# Patient Record
Sex: Male | Born: 1945 | Race: White | Hispanic: No | Marital: Married
Health system: Southern US, Community
[De-identification: ages and names within clinical notes are randomized; demographics above are authoritative.]

---

## 2010-06-25 ENCOUNTER — Ambulatory Visit: Payer: Self-pay | Admitting: Internal Medicine

## 2010-09-25 ENCOUNTER — Ambulatory Visit: Payer: Self-pay | Admitting: Internal Medicine

## 2011-03-07 ENCOUNTER — Ambulatory Visit: Payer: Self-pay | Admitting: Unknown Physician Specialty

## 2011-03-13 ENCOUNTER — Ambulatory Visit: Payer: Self-pay | Admitting: Internal Medicine

## 2011-03-13 ENCOUNTER — Ambulatory Visit: Payer: Self-pay | Admitting: Unknown Physician Specialty

## 2011-03-17 ENCOUNTER — Inpatient Hospital Stay: Payer: Self-pay | Admitting: Internal Medicine

## 2011-03-17 LAB — PATHOLOGY REPORT

## 2011-03-24 ENCOUNTER — Ambulatory Visit: Payer: Self-pay | Admitting: Internal Medicine

## 2011-03-26 ENCOUNTER — Ambulatory Visit: Payer: Self-pay | Admitting: Internal Medicine

## 2011-04-12 ENCOUNTER — Ambulatory Visit: Payer: Self-pay | Admitting: Internal Medicine

## 2011-06-30 ENCOUNTER — Ambulatory Visit: Payer: Self-pay | Admitting: Unknown Physician Specialty

## 2014-06-15 ENCOUNTER — Ambulatory Visit: Payer: Self-pay | Admitting: Orthopedic Surgery

## 2014-06-29 ENCOUNTER — Ambulatory Visit: Payer: Self-pay | Admitting: Orthopedic Surgery

## 2014-07-11 ENCOUNTER — Emergency Department: Payer: Self-pay | Admitting: Emergency Medicine

## 2014-07-12 ENCOUNTER — Ambulatory Visit
Admit: 2014-07-12 | Disposition: A | Discharge status: Home / Self Care | Payer: Self-pay | Attending: Internal Medicine | Admitting: Internal Medicine

## 2014-07-12 ENCOUNTER — Inpatient Hospital Stay: Payer: Self-pay | Admitting: Internal Medicine

## 2014-07-27 ENCOUNTER — Emergency Department: Payer: Self-pay | Admitting: Emergency Medicine

## 2014-07-30 ENCOUNTER — Inpatient Hospital Stay: Payer: Self-pay | Admitting: Internal Medicine

## 2014-08-03 LAB — BASIC METABOLIC PANEL
Anion Gap: 4 — ABNORMAL LOW (ref 7–16)
BUN: 13 mg/dL
CALCIUM: 8.1 mg/dL — AB
Chloride: 99 mmol/L — ABNORMAL LOW
Co2: 27 mmol/L
Creatinine: 0.64 mg/dL
GLUCOSE: 142 mg/dL — AB
POTASSIUM: 4.4 mmol/L
Sodium: 130 mmol/L — ABNORMAL LOW

## 2014-08-03 LAB — MAGNESIUM: Magnesium: 1.7 mg/dL

## 2014-08-08 LAB — CULTURE, BLOOD (SINGLE)

## 2014-08-11 ENCOUNTER — Ambulatory Visit
Admit: 2014-08-11 | Disposition: A | Discharge status: Home / Self Care | Payer: Self-pay | Attending: Internal Medicine | Admitting: Internal Medicine

## 2014-08-12 LAB — EXPECTORATED SPUTUM ASSESSMENT W REFEX TO RESP CULTURE

## 2014-08-12 LAB — BODY FLUID CULTURE

## 2014-08-26 LAB — CULTURE, BLOOD (SINGLE)

## 2014-09-10 NOTE — Consult Note (Signed)
PATIENT NAME:  Brandon Rhodes, Brandon Rhodes MR#:  [id number] DATE OF BIRTH:  31-Oct-1945  DATE OF CONSULTATION:  07/31/2014  REFERRING PHYSICIAN:  Dr. Allena Katz CONSULTING PHYSICIAN:  Stann Mainland. Sampson Goon, MD  INFECTIOUS DISEASE CONSULTATION    REASON FOR CONSULTATION: Bacteremia and pleural effusion.   HISTORY OF PRESENT ILLNESS: A pleasant 69 year old gentleman with recent admission for left-sided cheset pain and white out of his chest, found to have lung cancer with left lung collapse and pleural effusion. The patient had placement of Pleurx catheter by Dr. Thelma Barge. The catheter is for symptomatic relief. He started chemotherapy. He was admitted March 20 with shortness of breath and fever. Fevers to 101. The patient has been admitted and had a drainage from his catheter. Blood cultures were positive. The pleural fluid had 2800 white cells in it and Gram stain showed gram-negative coccobacillus. We are consulted for further antibiotic management.   PAST MEDICAL HISTORY:  1.  Left-sided lung cancer.  2.  Tobacco abuse.  3.  Alcohol use.  4.  COPD.   5.  Chronic pain syndrome.  6.  GERD.   SOCIAL HISTORY: The patient continues to smoke. Lives at home. No other drug use. Former alcohol use.   FAMILY HISTORY: Noncontributory.   ALLERGIES: NOVOCAIN AND PENICILLIN.   ANTIBIOTICS SINCE ADMISSION: Include azithromycin, meropenem, and vancomycin.   REVIEW OF SYSTEMS: Eleven systems reviewed and negative except as per HPI.   PHYSICAL EXAMINATION:  VITAL SIGNS: Temperature 98.3, pulse 92, blood pressure 92/57, respirations 17, saturation 94% on 2 L.  GENERAL: He is chronically ill-appearing. He is on oxygen. He is in some respiratory distress. He is quite thin.  HEENT: Pupils reactive. Sclerae anicteric. Oropharynx clear.  NECK: Supple.  HEART: Regular.  LUNGS: Marked decreased breath sounds on the left side.  ABDOMEN: Soft, nontender, mildly distended.  CHEST: He has left Pleurx catheter in his left chest.   EXTREMITIES: Trace edema, bilateral lower extremity.   LABORATORY DATA: Blood cultures, March 20, gram-negative coccobacillus in 2/2 cultures. Sputum culture is being held for possible pathogen. Thoracentesis fluid, March 21, many white cells, a few gram-negative coccobacillus. Body fluid analysis, March 21, had 2802 white cells in it, 79% of which are neutrophils. LDH was 224, glucose 107. Fluid from March 21 has 338 white cells, 53 neutrophils, 38 lymphocytes, glucose at that time was 140, protein 2.8.   IMAGING: CT of the chest, March 20, showed collapse of the left lung, large necrotic mass involving much of the left lung. There is necrotic fluid tracking into the left main stem bronchus just inferior to the level of the carina. There is soft tissue mass also noted extending into the left side of the mediastinum surrounding the left main stem bronchus. A very large left-sided pneumothorax.   IMPRESSION: A 69 year old gentleman newly diagnosed lung cancer underwent chemotherapy and has Pleurx catheter in place. He was admitted with fevers. He has had blood cultures, which are positive for gram-negative coccobacillus. Pleural fluid is also positive on Gram stain for gram-negative coccobacillus with 2800 white cells, mainly neutrophils, in his pleural fluid.   RECOMMENDATIONS:  1.  Continue current antibiotics. Further antibiotic recommendations pending identification of the organism. He has necrotic mass, which may also have some infection in it.  2.  He may need a further management of the fluid with a chest tube if he cannot be drained with the Pleurx catheter. I will need to talk with Dr. Thelma Barge further about this.  Thank you for the  consult. I will be glad to follow with you.    ____________________________ Stann Mainlandavid P. Sampson GoonFitzgerald, MD dpf:bm D: 07/31/2014 22:08:40 ET T: 07/31/2014 23:36:40 ET JOB#: [id number]  cc: Stann Mainlandavid P. Sampson GoonFitzgerald, MD, <Dictator> Derryl Uher Sampson GoonFITZGERALD MD ELECTRONICALLY SIGNED  08/03/2014 22:15

## 2014-09-10 NOTE — H&P (Signed)
PATIENT NAME:  Brandon Rhodes, Brandon Rhodes MR#:  [id number] DATE OF BIRTH:  Mar 21, 1946  DATE OF ADMISSION:  07/12/2014  CHIEF COMPLAINT AND REASON FOR ADMISSION: Newly diagnosed large left pleural effusion with opacified left chest on CT scan done on 07/11/2014, left lung collapse. Poor oral intake with clinical dehydration, hypercalcemia. Likely has lung cancer, with pleural fluid. Known history of cirrhosis of the liver.   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 Mechele Collin, history of adenomatous polyp of the colon who more recently has been declining over the last 1-2 months. The patient has had progressive cough, dyspnea on exertion and sometimes at rest. He also has not been eating and drinking well for the past 1-2 weeks, and is feeling weaker. States that he has lost about 7-8 pounds in the last 1-2 months. He was found to have grossly abnormal chest x-ray and went to Emergency Room yesterday where CT scan of the chest, abdomen, and pelvis was done, which reported left lung completely collapsed with areas of necrosis in the left lower lobe most compatible with necrotic neoplasm along with a large left pleural effusion. There was evidence of cirrhosis of the liver, but no definite metastatic disease. There was severe emphysema in the right lung. The patient states that he is very weak and ambulates minimally due to his symptoms. The patient's wife and son were present who states that he is not eating and drinking well at all and are very concerned that his condition will decline at home and will pass out. He denies any major pain issues; he has had some shoulder area pain for the last few weeks. He had a bone scan done on February 18, which reported no evidence of skeletal metastasis and an MRI of the left shoulder without contrast which  reported unusual subtle area of abnormal edema in the undersurface of the acromion raising possibility of a small metastasis versus other etiology. Currently states that shoulder pain is doing better. No bleeding issues including blood in stools or urine, recurrent epistaxis, hemoptysis, or hematemesis.   PAST MEDICAL HISTORY AND SURGICAL HISTORY: As in HPI above.   FAMILY HISTORY: Noncontributory, denies malignancy.   SOCIAL HISTORY: Chronic smoker, ongoing, about 1/2 pack per day. Denies any recent alcohol intake or recreational drug usage.   ALLERGIES: INCLUDE NOVOCAINE AND PENICILLIN.   HOME MEDICATIONS:  1.  Advair inhaler 250/50, 1 puff b.i.d.  2.  Imodium AD 1 tablet 3-4 times daily p.r.n. for diarrhea.  3.  Lasix 20 mg p.o. daily.  4.  Maalox 15 mL q.i.d. p.r.n.  5.  Vitamin K.  6.  Zantac 150 mg p.o. b.i.d.  7.  Spiriva 18 mcg inhaler daily.  8.  Spironolactone 50 mg b.i.d.  9.  Trazodone 50 mg at bedtime p.r.n.   REVIEW OF SYSTEMS: CONSTITUTIONAL: As in HPI. No fevers or chills. No night sweats.  HEENT: Has mild dizziness intermittently on getting up and ambulating. No headaches, epistaxis, ear or jaw pain. No sinus symptoms.  CARDIAC: No angina, palpitation, orthopnea, or PND.  LUNGS: As in HPI.  GASTROINTESTINAL: No nausea or vomiting. No diarrhea. No blood in stools or melena.  GENITOURINARY: No dysuria or hematuria. Urine output is less.  SKIN: No new rashes or pruritus.  HEMATOLOGIC: No bleeding issues.  MUSCULOSKELETAL: As in HPI.  NEUROLOGIC: No new focal weakness, seizures, or  loss of consciousness.  ENDOCRINE: No polyuria or polydipsia.   Performance status ECOG 2.   PHYSICAL EXAMINATION: GENERAL: The patient is a moderately built, thin, and weak-looking individual, sitting in wheelchair, otherwise alert and oriented and converses appropriately. No acute distress. No icterus. Mild pallor.  VITAL SIGNS: 97.4, 48, 18, 104/65, 96% on room air.  HEENT:  Normocephalic, atraumatic. Extraocular movements intact. Sclerae anicteric. Mouth is dry.  NECK: Negative for lymphadenopathy or JVD.  CARDIOVASCULAR: S1 and S2, regular rate and rhythm.  LUNGS: Show markedly diminished breath sounds on the left side, also slightly diminished on the right side. No crepitations or rhonchi.  ABDOMEN: Soft, nontender. No hepatosplenomegaly or ascites clinically.  EXTREMITIES: No pedal edema or cyanosis.  SKIN: No generalized rashes or bruising.  MUSCULOSKELETAL: No obvious joint redness or swelling.  NEUROLOGIC: Limited exam, grossly nonfocal. Cranial nerves intact.  Gait not checked.   LABORATORY RESULTS: From March 1, BUN 29, creatinine 0.95, potassium 4.2, calcium elevated at 10.6. LFT shows bilirubin 0.5, alkaline phosphatase 281, ALT 240, AST 124, protein 7.3, albumin 2.4. Troponin I less than 0.02. WBC 22,300, hemoglobin 14.3, platelets 558,000. ANC 19,400.   IMPRESSION AND PLAN: 1.  Chronic smoker with newly diagnosed large left-sided pleural effusion, left lung collapse with progressive dyspnea, cough, and declining condition. Findings are highly suspicious for underlying malignancy with pleural effusion. The patient does not have any fevers or chills to suggest active infection at this time. He does have some leukocytosis, but this could be secondary to recent prednisone taper he has received for his shoulder pain. Will continue to monitor for fever and infection. Given his severe symptoms and remarkable CT scan abnormalities, we will hospitalize for planning left-sided diagnostic and therapeutic thoracentesis and send pleural fluid for glucose, protein, cell count, and differential, culture and cytology for malignant cells. We will consult pulmonologist for possible bronchoscopy if pleural fluid does not give us diagnosis, and also since he has left lung collapse along with management of chronic obstructive pulmonary disease. We will continue on current  medications including Advair, Spiriva and add albuterol SVN as needed. Will use oxygen as needed.  2.  Decreased oral intake, clinical dehydration, hypercalcemia, elevated calcium, likely related to possible malignancy.   PLAN:  1.  To hold diuretics, start on aggressive IV hydration with half normal saline 125 mL per hour and once he is adequately hydrated and renal functions are doing well, will consider bisphosphonate therapy.  2.  Continue other home medications same as before. Will hold diuretics given above issues.  3.  History of cirrhosis of the liver with coagulopathy. Will give vitamin K 10 mg subcutaneous today. Check PT and PTT, and if necessary may need to give fresh frozen plasma transfusion prior to invasive procedures. The patient has been explained about risks and benefits of transfusion of FFP and other blood products and he is agreeable to this and signed consent form.  4.  I have explained CT scan findings to patient and wife and son present and that if he does have a malignant pleural effusion, it would represent stage IV lung cancer. Further discussion and treatment plan will be made once diagnosis is clear. They are agreeable to this plan.    ____________________________ Maren ReamerSandeep R. Sherrlyn HockPandit, MD srp:LT D: 07/12/2014 15:57:41 ET T: 07/12/2014 16:38:08 ET JOB#: [id number]  cc: Elsye Mccollister R. Sherrlyn HockPandit, MD, <Dictator> Wille CelesteSANDEEP R Yadriel Kerrigan MD ELECTRONICALLY SIGNED 07/12/2014 23:17

## 2014-09-10 NOTE — Discharge Summary (Signed)
Rhodes NAME:  Brandon Rhodes, Brandon Rhodes MR#:  [id number] DATE OF BIRTH:  Mar 20, 1946  DATE OF ADMISSION:  07/12/2014 DATE OF DISCHARGE:  07/20/2014  DIAGNOSES:   1.  Left pleural effusion, central left lung mass with collapse of left lung, newly diagnosed squamous lung cancer-underwent 1.5 liters thoracentesis on 07/13/2014. Underwent bronchoscopy on 07/18/2014, underwent insertion of left side Pleurx catheter by Dr. Thelma Barge on 07/18/2014.  2.  Dehydration, hypercalcemia-improved.   HISTORY OF PRESENT ILLNESS: Brandon Rhodes is a 69 year old gentleman with past medical history of chronic smoking, alcoholic cirrhosis of Brandon liver followed by GI, history of colon polyp, who had declining condition along with cough, dyspnea on exertion, and weight loss. He had CT scan of Brandon chest which showed left lung collapse with areas of necrosis in Brandon left lower lobe compatible with necrotic neoplasm along with large left pleural effusion. [facility] presented to [facility] on March 2 after he went to [facility] previous day and was told that he needed to be seen at [facility] right away, upon visit at cancer center he had poor oral intake with severe weakness, clinical dehydration, and hypercalcemia. He also had significant dyspnea on moving and minimal activity. He was therefore hospitalized for further care. For past medical history, social history, medications, exam findings and laboratory results-refer to history and physical note for details.   HOSPITAL COURSE: Brandon Rhodes was admitted to [facility] onc[facility] floor and started on aggressive IV hydration, to which Brandon hypercalcemia responded and he did not need bisphosphonate therapy. He underwent left-sided thoracentesis on March 3 and 1.5 liters were removed, cytology was negative for malignant cells. PET scan was done which showed large centrally located left hilar mass with extension into mediastinum with possible adenopathy suspicious of advanced lung cancer. Also  chest x-ray post thoracentesis did not show much improvement and symptomatically he was doing poorly. Brandon Rhodes was seen by Dr. Belia Heman and Dr. Thelma Barge and they did Brandon bronchoscopy and endobronchial bronchial lesion was biopsied, which came back as squamous cell carcinoma, Dr. Thelma Barge placed left-sided Pleurx catheter to facilitate frequent drainage of Brandon large left pleural effusion. Brandon Rhodes was continued on supportive treatment, bronchodilators, oxygen as needed, and pain control. He was given steroids also. With above measures, he showed some improvement in his symptoms. Detailed discussion was done regarding Brandon finding of advanced stage lung cancer, at least stage IIIB squamous cell carcinoma but possibly could be stage IV if pleural effusion is malignant. He was seen by radiation oncologist Dr. Rushie Chestnut to plan radiation therapy given left lung collapse. Plan also was to follow up as outpatient and start on concurrent chemotherapy with Taxol and carboplatin on a weekly schedule given his overall poor performance status. Brandon Rhodes and family were also explained that given advanced stage lung cancer, disease was likely incurable, treatments offered were with palliative intent only, and overall prognosis was likely poor. He was discharged home on March 10 in stable condition.   DISCHARGE MEDICATIONS: Advair Diskus 250/50 inhaler b.i.d., Zantac 150 mg b.i.d., trazodone 50 mg at bedtime as needed, Imodium AD t.i.d. as needed for diarrhea, Maalox 10 mL  q.i.d. as needed, Spiriva 18 mcg inhaler once daily, prednisone taper 30 mg x 1, then decrease dose by 5 mg daily and stop, spironolactone 50 mg b.i.d., Ventolin inhaler 2 puff q.i.d. as needed, oxycodone 5-10 mg every 3-4 hours as needed for pain.   DIET: Regular.   ACTIVITY: As tolerated, no exertional activity or  heavy lifting.   FOLLOWUP: At cancer center on 07/24/2014 with laboratories and get chemotherapy, and keep radiation oncology appointments as  already scheduled.     ____________________________ Maren ReamerSandeep R. Sherrlyn HockPandit, MD srp:bu D: 07/24/2014 14:15:55 ET T: 07/24/2014 15:58:06 ET JOB#: [id number]  cc: Gurneet Matarese R. Sherrlyn HockPandit, MD, <Dictator> Wille CelesteSANDEEP R Requan Hardge MD ELECTRONICALLY SIGNED 07/25/2014 0:56

## 2014-09-10 NOTE — H&P (Signed)
PATIENT NAME:  Brandon Rhodes, Brandon Rhodes MR#:  [id number] DATE OF BIRTH:  [birth year]  DATE OF ADMISSION:  07/30/2014  CHIEF COMPLAINT:  Shortness of breath, fever.   HISTORY OF PRESENT ILLNESS:  The patient was seen and examined on 07/30/2014. This is a 69 year old male patient with history of left-sided main bronchus lung cancer with left lung collapse and pleural effusion with PleurX catheter in place, who presents to the Emergency Room complaining of worsening shortness of breath. The patient has had low-grade fevers at home, but today he was noticed to have fever of greater than 101. Here in the Emergency Room, the patient has been afebrile, but tachycardic into the 130s, and with fever and left pleural effusion, possible empyema, he is being admitted to the hospitalist service.   The patient was recently admitted to the hospital in the first week of March with left-sided thoracentesis with no malignant cells in the fluid. He had a Pleurx catheter placed, which was left in place. He followed with Dr. Thelma Barge on the 17th. Draining of the fluid was thought to not help due to the lung collapse. The patient did receive 1 round of chemotherapy earlier in the week. He has had problems with constipation due to his pain medications. He does have clear sputum. No recent antibiotic use. Presently, with his lung cancer, plan is to have chemotherapy and radiation.   Here in the Emergency Room, the patient has been found to have elevated lactic acid of 2.2 along with tachycardia and possible left-sided pneumonia and empyema. He is admitted to the hospitalist service.   PAST MEDICAL HISTORY: 1.  Left-sided lung cancer with left lung collapse and chronic left pleural effusion.  2.  Tobacco abuse.  3.  Alcoholic cirrhosis of the liver.  4.  COPD.  5.  Chronic pain syndrome.  6.  GERD.   ALLERGIES:  NOVOCAINE AND PENICILLIN.   SOCIAL HISTORY:  The patient continues to smoke. No illicit drug use. Lives at home. Does not  use any oxygen.   CODE STATUS:  DO NOT RESUSCITATE/DO NOT INTUBATE, as discussed with the patient with wife and daughter at bedside.   FAMILY HISTORY:  No family history of lung cancer.   HOME MEDICATIONS: 1.  Spiriva 18 mcg inhaled daily.  2.  Advair Diskus 1 puff b.i.d.  3.  Albuterol nebulizer q. 4 p.r.n.  4.  Allopurinol 100 mg daily.  5.  Duragesic 12 mcg patch every 3 days.  6.  Imodium orally 3 times a day as needed.  7.  Maalox 10 mL orally 4 times a day as needed.  8.  Oxycodone 5 mg every 6 hours as needed.  9.  Ranitidine 150 mg orally 2 times a day.  10.  Spironolactone 50 mg orally 2 times a day.  11.  Trazodone 50 mg daily at bedtime as needed.   REVIEW OF SYSTEMS: CONSTITUTIONAL:  Complains of fatigue, weakness, and weight loss.  EYES:  No blurred vision, pain, or redness.  EARS, NOSE, AND THROAT:  No tinnitus, ear pain, or hearing loss.  RESPIRATORY:  Has a chronic cough and shortness of breath.  CARDIOVASCULAR:  No chest pain, orthopnea, or edema,  GASTROINTESTINAL:  No nausea, vomiting, diarrhea, or abdominal pain.  GENITOURINARY:  No dysuria, hematuria, or frequency.  ENDOCRINE:  No polyuria, nocturia, or thyroid problems. HEMATOLOGIC AND LYMPHATIC:  No anemia or easy bruising or bleeding.  INTEGUMENTARY:  No rash or lesion.  MUSCULOSKELETAL:  Back pain.  NEUROLOGIC:  No focal numbness or weakness.  PSYCHIATRIC:  He seems to have depression.   PHYSICAL EXAMINATION: VITAL SIGNS:  Temperature 98.2 but 101 at home, pulse 132, blood pressure 143/74, saturating 100% on 3 liters of oxygen.  GENERAL:  Thin, frail Caucasian male patient lying in bed in respiratory distress with conversational dyspnea.  PSYCHIATRIC:  He is alert and oriented x 3 and anxious.  HEENT:  Atraumatic, normocephalic. Oral mucosa is dry and pink. Pallor positive. No icterus.  NECK: Supple. No thyromegaly. No palpable lymph nodes. Trachea is midline. No carotid bruit or JVD. CARDIOVASCULAR:   S1 and S2, tachycardic. No murmurs. No edema.  RESPIRATORY:  Has decreased air entry on the left side and some mild rhonchi on the right.  GASTROINTESTINAL: Soft and nontender. Bowel sounds present.  GENITOURINARY:  No CVA tenderness or bladder distention. SKIN:  Warm and dry. No petechiae or rash.  MUSCULOSKELETAL:  No joint swelling, redness, or effusion of the large joints. Has diffuse muscle wasting.  NEUROLOGICAL:  Motor strength is 5/5 in upper extremities.  LYMPHATIC:  No cervical lymphadenopathy.   LABORATORY STUDIES:  Show a glucose of 172, BUN 23, creatinine 0.81, sodium 128, potassium 4.5, chloride 96, lactic acid 2.2, and magnesium of 1.6. AST, ALT, and alkaline phosphatase were mildly elevated with albumin of 2.1.   Troponin is 0.04.   WBC is 11.7, hemoglobin 11.6, platelets 215,000.   INR is 1.3.   Urinalysis shows no bacteria.   EKG shows sinus tachycardia, which seems like multifocal atrial tachycardia.   Chest x-ray shows left-sided large pleural effusion.   CT scan of the lungs shows no pulmonary embolism, but does show a necrotic lung mass on the left side with complete lung collapse and large left hydropneumothorax with a PleurX catheter in place. Underlying emphysematous changes in the right lung, but no pneumonia on the right side.   ASSESSMENT AND PLAN: 1.  Severe sepsis with acute respiratory failure in a patient with persistent left pleural effusion and lung cancer. At this point, there is concern for empyema. The patient will be started on broad-spectrum antibiotics and admitted as inpatient. We will consult Dr. Thelma Barge regarding the plan with the pleural effusion and his PleurX catheter. We will also consult Dr. Sherrlyn Hock to follow the patient with his lung cancer and ongoing care. At this point, the patient is critically ill. We will give fluid boluses for his lactic acidosis and severe sepsis. It needs to be monitored closely.  5.  MAT. This is secondary to his  chronic obstructive pulmonary disease and sepsis.  6.  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 He does have chronic mild hyponatremia, which seems stable.  Cirrhosis does not seem to have much ascites at this point. We will hold his Aldactone due to recent hyperkalemia.  7.  Hypomagnesemia. Replace orally.  8.  Deep vein thrombosis prophylaxis with Lovenox.   CODE STATUS:  DO NOT RESUSCITATE/DO NOT INTUBATE, as discussed with the patient. The patient's DO NOT RESUSCITATE was reversed during recent admission by the wife to full code during his thoracentesis, but at this point from my discussion with the patient with wife and daughter at bedside, he wishes to be DO NOT RESUSCITATE/DO NOT INTUBATE. I have offered to consult palliative care, Dr. Harvie Junior, but the patient does not want to see her at this point. I have encouraged him to further discuss with his oncologist, Dr. Sherrlyn Hock, and  Dr. Thelma Bargeaks.   Critical care time spent on this patient today:  55 minutes again.    ____________________________ Molinda BailiffSrikar R. Samael Blades, MD srs:nb D: 07/31/2014 00:19:16 ET T: 07/31/2014 00:39:34 ET JOB#: [id number]  cc: Wardell HeathSrikar R. Bryar Dahms, MD, <Dictator> Sandeep R. Sherrlyn HockPandit, MD Sheppard Plumberimothy E. Thelma Bargeaks, MD Jillene Bucksenny C. Arlana Pouchate, MD Orie FishermanSRIKAR R Jamie-Lee Galdamez MD ELECTRONICALLY SIGNED 08/01/2014 2:24

## 2014-09-10 NOTE — Consult Note (Signed)
PATIENT NAME:  Brandon Rhodes, Brandon Rhodes MR#:  [id number] DATE OF BIRTH:  [birth year]  DATE OF CONSULTATION:  07/31/2014  REFERRING PHYSICIAN:  Sona A. Allena Katz, MD   CONSULTING PHYSICIAN:  Jeliyah Middlebrooks R. Sherrlyn Hock, MD  ONCOLOGY CONSULTATION:  REASON FOR CONSULTATION:  Lung cancer, admitted with fever and infection.   HISTORY OF PRESENT ILLNESS:  The patient is a 69 year old gentleman with history of multiple medical problems including alcoholic cirrhosis of the liver, history of smoking abuse, COPD, chronic pain syndrome, and GERD, who recently was diagnosed with advanced non-small cell lung cancer (squamous) with large central mediastinal and left hilar mass and extensive left pleural effusion status post Pleurx catheter. Lung cancer is at least stage IIIB (possibly IV if pleural fluid is malignant). The patient was started on radiation last week along with first dose of weekly low-dose Taxol/carboplatin given on March 15. He has now been admitted to the hospital with progressive weakness, shortness of breath, fever, and found to have bacteremia and sepsis. He is on broad-spectrum IV antibiotics. He is extremely weak. Oral intake is poor. He states that his left shoulder area pain is still poorly controlled with current dose of Percocet; it helps pain, but does not last long enough. He also has intermittent feeling of respiratory distress. The patient already is DO NOT RESUSCITATE status.   PAST MEDICAL AND SURGICAL HISTORY:  As in the HPI above.   FAMILY HISTORY:  Noncontributory.   SOCIAL HISTORY:  History of smoking and alcohol in the past. No recreational drug usage. Lives at home with his wife.   ALLERGIES:  PENICILLIN, NOVOCAINE.   HOME MEDICATIONS:  Advair Diskus 1 puff b.i.d., albuterol nebulizer q. 4 hours p.r.n., allopurinol 100 mg daily, Duragesic 12 mcg patch q. 72 hours, Imodium t.i.d. p.r.n. for diarrhea, Maalox 20 mL q.i.d. p.r.n., oxycodone 5 mg q. 6 hours p.r.n., Zantac 150 mg b.i.d.,  spironolactone 50 mg b.i.d., trazodone 50 mg at bedtime as needed, Spiriva 18 mcg inhaler daily.   REVIEW OF SYSTEMS: CONSTITUTIONAL:  Severe weakness and fatigue. No fevers. No chills today.  HEENT:  Denies any headaches or dizziness at rest. No epistaxis or ear or jaw pain.  CARDIAC:  Denies any angina, palpitation, orthopnea, or PND.  LUNGS:  As in HPI. Has significant difficulty with dyspnea on ambulating or moving, also sometimes at rest. Intermittent cough. No hemoptysis.  GASTROINTESTINAL:  No nausea, vomiting, or diarrhea. No blood in stools or melena. Had recent severe constipation, currently better.  GENITOURINARY:  No dysuria or hematuria.  EXTREMITIES:  No new pain or swelling.  SKIN:  No new rashes or pruritus.  HEMATOLOGIC:  Denies obvious bleeding issues.  NEUROLOGIC:  No new focal weakness, seizures, or loss of consciousness.  ENDOCRINE:  No polyuria or polydipsia. Appetite is poor.   PHYSICAL EXAMINATION: GENERAL:  The patient is very weak and tired looking, resting in bed, on nasal cannula oxygen, otherwise alert and oriented and converses appropriately. No icterus.  VITAL SIGNS:  Temperature 98.3, pulse 92, respirations 17, blood pressure 92/57, oxygen saturation 94% on 2 liters of oxygen.  HEENT:  Normocephalic, atraumatic. Extraocular movements are intact. Sclerae are anicteric.  NECK:  Negative for JVD or lymphadenopathy.  CARDIOVASCULAR SYSTEM:  S1 and S2, irregular.  LUNGS:  Show absent breath sounds on the left side and good air entry on the right side with no rhonchi.  ABDOMEN:  Soft, nontender. No hepatomegaly.  EXTREMITIES:  Show no major edema or cyanosis.  SKIN:  Shows no  generalized rashes or major bruising.   NEUROLOGIC:  Cranial nerves seem intact. Moves all extremities spontaneously.   LABORATORY DATA:  WBC is 9500, hemoglobin 9.8, platelets 163,000, and ANC is 8800. Creatinine is 0.68, BUN 16, and calcium is 7.7. Blood culture is showing gram-negative  coccobacilli. Body fluid culture is showing a few gram-negative coccobacilli, identification pending.   IMPRESSION AND RECOMMENDATIONS:  This is a 69 year old gentleman with recently diagnosed advanced stage non-small cell lung cancer, left lung complete collapse with central tumor and large persistent pleural effusion, admitted with fever, declining respiratory condition, and possible sepsis and infection. Blood cultures are growing gram-negative coccobacilli, possibly also in pleural fluid. Agree with ongoing broad-spectrum antibiotic coverage. Agree with infectious disease and Thoracic surgery consult. For pain control, will switch from oxycodone to Roxanol p.r.n. for moderate pain or respiratory distress given frequent exacerbation of these symptoms. The patient is not neutropenic. He has received only 1 dose of low-dose weekly Taxol/carboplatin chemotherapy on the 15th. Will need to hold cancer treatment including radiation and chemotherapy given ongoing acute sickness and severe infection. Monitor blood counts. Will continue to follow. If condition improves, then would initially restart on radiation therapy as decided by Dr. Rushie Chestnuthrystal and then subsequently add chemotherapy if his condition improves and he tolerates radiation. The patient and family are also aware that his overall prognosis is extremely poor. The patient is DO NOT RESUSCITATE status. Will continue to follow as indicated during hospitalization.   Thank you for the referral. Please feel free to contact me if any additional questions.    ____________________________ Maren ReamerSandeep R. Sherrlyn HockPandit, MD srp:nb D: 07/31/2014 23:30:35 ET T: 07/31/2014 23:50:03 ET JOB#: [id number]  cc: Toshie Demelo R. Sherrlyn HockPandit, MD, <Dictator> Wille CelesteSANDEEP R Guiseppe Flanagan MD ELECTRONICALLY SIGNED 08/01/2014 8:45

## 2014-09-10 NOTE — Discharge Summary (Signed)
PATIENT NAME:  Brandon Rhodes, Brandon Rhodes MR#:  [id number] DATE OF BIRTH:  [birth year]  DATE OF ADMISSION:  07/30/2014 DATE OF DISCHARGE:  08/04/2014  PRESENTING COMPLAINT: Shortness of breath, fevers.  DISCHARGE DIAGNOSES:  1.  Pasteurella sepsis.   2.  Infected pleural fluid, status post PleurX catheter with ongoing drainage on a daily basis. 3.  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 The patient undergoing chemotherapy and radiation.  4.  Hypomagnesemia, hyponatremia. 5.  Adult failure to thrive with malnutrition and cachexia due to advanced lung cancer. 6.  Constipation.  7.  Multifocal atrial tachycardia, suspected due to sepsis and chronic obstructive pulmonary disease. 8.  CODE STATUS:  NO CODE, NO NOT RESUSCITATE.  CONSULTATIONS:  1.  Stann Mainland. Sampson Goon, MD, ID 2.  Sandeep R. Sherrlyn Hock, MD, hematology 3.  Timothy E. Oaks, MD, surgery  LABORATORY DATA: Blood cultures from March 24 negative. Glucose is 142, BUN is 13, creatinine is 0.6, sodium is 130, potassium is 4.4, chloride is 99. Magnesium is 1.7. Lactic acid on admission was 2.2. Creatinine on admission was 0.81, sodium was 128. Blood cultures were positive for Pasteurella. Pleural fluid positive for a few gram-negative cocci bacilli, suspected Pasteurella. Urine culture negative in 36 hours.   DIET:  1.  Regular. 2.  Ensure 1 can 3 times a day. 3.  Magic Cup on lunch and dinner trays.  FOLLOWUP:  1.  With Dr. Sampson Goon in 2 weeks.  2.  Followup with Dr. Sherrlyn Hock and Dr. Sampson Goon as according to the instructions.  3.  Nursing staff to drain pleural fluid about 500 to a liter on a daily basis.  MEDICATIONS:  1.  Levaquin 750 mg p.o. daily for 14 days.   2.  Spironolactone 50 mg p.o. daily. 3.  Docusate 2 tablets b.i.d.  4.  Lactulose 30 mL t.i.d. until the patient has a good BM. 5.  Megace 400 mg b.i.d.  6.  Metoprolol 12.5 mg b.i.d.  7.  Dulcolax EC 10 mg p.o. daily. 8.  Roxanol 5 to 10 mL 1  to 2 hourly p.r.n. for moderate pain. 9.  Ranitidine 150 mg b.i.d.  10.  Fentanyl patch 12 mcg every 3 days. 11.  Temazepam 15 mg at bedtime p.r.n.  12.  Spiriva 1 capsule inhalation daily. 13.  Advair 250/50 one puff b.i.d.  14.  Tylenol 650 mg q. 4 hours p.r.n.  15.  Mag-Al Plus suspension 10 mL t.i.d. p.r.n.  16.  Trazodone 10 mg at bedtime.   BRIEF SUMMARY OF HOSPITAL COURSE: Mr. Sayed is a pleasant 69 year old Caucasian gentleman who was recently diagnosed with newly squamous cell carcinoma, underwent PleurX catheter placement for malignant pleural effusion, came in with: 1.  Severe sepsis and acute respiratory failure with persistent left pleural effusion and lung cancer. He was initially started on IV vancomycin and meropenem, changed to p.o. Levaquin once blood cultures were positive for gram-negative cocci bacilli, which lab isolated it to be Pasteurella. The patient's repeat blood cultures have been negative. He will finish up a course of 2 weeks of antibiotics with Levaquin and followup with Dr. Sampson Goon.  2.  Malignant pleural effusion with pleural fluid on Gram stain showing a few gram-negative cocci bacilli. The patient was getting daily pleural fluid drainage, about 300 to a liter, 1000 mL was removed.  3.  MAT secondary to chronic pulmonary disease and sepsis. The patient was placed on metoprolol b.i.d.  4.  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 The patient does have it seems chronic mild hyponatremia which seems stable. He received IV fluids. 5.  Hypomagnesemia, repleted. 6.  Adult failure to thrive with malnourishment, cachexia in the setting of the advanced lung cancer, now on Megace. 7.  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 Sherrlyn HockPandit. 8.  Constipation. The patient was getting quite a few things on his bowel prep.  The patient is going to be discharged to [facility] for  rehabilitation.  TIME SPENT: 40 minutes.  CODE STATUS: He is a FULL CODE.    ____________________________ Wylie HailSona A. Allena KatzPatel, MD sap:TT D: 08/04/2014 13:34:00 ET T: 08/04/2014 23:53:58 ET JOB#: [id number]  cc: Stann Mainlandavid P. Sampson GoonFitzgerald, MD Sandeep R. Sherrlyn HockPandit, MD Sheppard Plumberimothy E. Thelma Bargeaks, MD Keani Gotcher A. Allena KatzPatel, MD, <Dictator>   Willow OraSONA A Alaze Garverick MD ELECTRONICALLY SIGNED 08/14/2014 12:25

## 2014-09-10 NOTE — Discharge Summary (Signed)
PATIENT NAME:  Brandon Rhodes, Praneeth T MR#:  [id number] DATE OF BIRTH:  1946-03-06  DATE OF ADMISSION:  07/30/2014 DATE OF DISCHARGE:  08/06/2014  ADDENDUM TO DISCHARGE DICTATED ON 07/31/2014  PRESENTING COMPLAINT: Shortness of breath and fever.   DISCHARGE DIAGNOSES:  1.  Severe sepsis with acute respiratory failure and persistent left malignant pleural effusion with history of lung cancer.  2.  Multi atrial tachycardia.  3.  Hyponatremia, suspected due to syndrome of inappropriate antidiuretic hormone with history of lung cancer.  4.  Cirrhosis.  5.  Hypomagnesemia.  6.  Adult failure to thrive with severe malnourishment and cachexia due to advanced lung cancer.  7.  History of newly diagnosed squamous cell carcinoma.  CONSULTATIONS:  Sandeep R. Sherrlyn HockPandit, M.D.   CODE STATUS: No code, DNR.  HOSPITAL COURSE:  After reviewing in detail with Dr. Sherrlyn HockPandit and with patient's family members, Mr. Willa RoughHicks and family requested hospice facility given his overall declining condition and unable to withstand any further ongoing treatment for his advancing cancer. Arrangements were made initially to go home; however, after discussion with the family and patient they decided to go to hospice health. The remainder of the discharge instructions and medications remain the same as in previous discharge summary.   ____________________________ Wylie HailSona A. Allena KatzPatel, MD sap:sp D: 08/15/2014 15:32:00 ET T: 08/15/2014 16:14:40 ET JOB#: [id number]  cc: Ellagrace Yoshida A. Allena KatzPatel, MD, <Dictator> Willow OraSONA A Tishara Pizano MD ELECTRONICALLY SIGNED 08/22/2014 16:37

## 2014-09-10 NOTE — Op Note (Signed)
PATIENT NAME:  Brandon Rhodes, Brandon Rhodes DATE OF BIRTH:  1945-10-28  DATE OF PROCEDURE:  07/18/2014  SURGEON: Marcial Pacasimothy E. Thelma Bargeaks, MD   ASSISTANT: None.   PREOPERATIVE DIAGNOSIS: Recurrent left-sided pleural effusion.   POSTOPERATIVE DIAGNOSIS: Recurrent left-sided pleural effusion.   OPERATION PERFORMED: Insertion of Pleurx catheter.   INDICATIONS FOR PROCEDURE: [name] is a 69 year old gentleman who presented with a large left-sided pleural effusion and radiographic evidence of a malignant pleural effusion. He also had evidence of complete left mainstem obstruction by CT and PET. The patient was apprised of the indications and risks of the procedure, and he gave his informed consent.   DESCRIPTION OF PROCEDURE: The patient was brought to the operating suite and placed in the supine position. General endotracheal anesthesia was given with a single-lumen tube. Dr. Santiago Gladavid Kasa performed a bronchoscopy, which will be dictated separately, but in essence showed complete obstruction of the left mainstem bronchus. There were some areas of the left upper lobe that had remnants of what might have been the lingula and the apical portions of the upper lobe bronchus, but these were very difficult to see distally into the mainstem bronchus. In any event, the patient was then turned after he underwent his bronchoscopy for a left-sided Pleurx catheter insertion. The patient was prepped and draped in the usual sterile fashion. We made a small skin incision overlying the rib at about the eighth intercostal space. The incision was deepened down through the muscles of the chest wall until the pleural space was entered. We tunneled the catheter anteriorly and brought this out underneath the left nipple. This was secured to the skin with silk sutures. The catheter was placed through our small incision in the intercostal muscles and issued forth immediately a large quantity of clear yellow fluid. The muscles  were closed over top of the catheter, and the subcutaneous tissues were closed as well. The skin was closed with nylon. The patient was then rolled in the supine position where he was extubated and taken to the recovery room in stable condition. It should be noted, that there was approximately 1800 mL of pleural effusion present. This did not appear to be bloody in any way. We did send this for cytology. Through a very small incision in the intercostal space I do not see the lung expand.    ____________________________ Sheppard Plumberimothy E. Thelma Bargeaks, MD teo:bm D: 07/18/2014 16:14:00 ET T: 07/19/2014 01:11:55 ET JOB#: [id number]  cc: Marcial Pacasimothy E. Thelma Bargeaks, MD, <Dictator> Sandeep R. Sherrlyn HockPandit, MD Jasmine DecemberIMOTHY E Kapono Luhn MD ELECTRONICALLY SIGNED 08/21/2014 12:07

## 2014-09-10 DEATH — deceased

## 2016-10-10 IMAGING — CT CT CHEST-ABD-PELV W/ CM
2 of 5 series · 12 of 46 positions shown, 14 images · IV contrast (omnipaque)
Comparison: CT 03/07/2011.

CLINICAL DATA: Opacification of the LEFT hemithorax. Elevated liver
function tests. Weight loss. Dyspnea.

EXAM:
CT CHEST, ABDOMEN, AND PELVIS WITH CONTRAST
TECHNIQUE: Multidetector CT imaging of the chest, abdomen and pelvis was
performed following the standard protocol during bolus
administration of intravenous contrast.
CONTRAST:  100 mL Omnipaque 300.

[Series 2: cap with · axial · 0.71mm/px · z∈[-556,+38]mm · 9 of 135 slices shown, 11 images]
[im 8/135  soft-tissue]
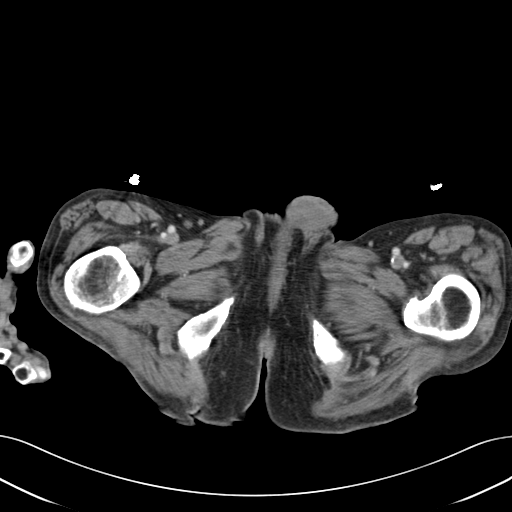
[im 8/135  bone]
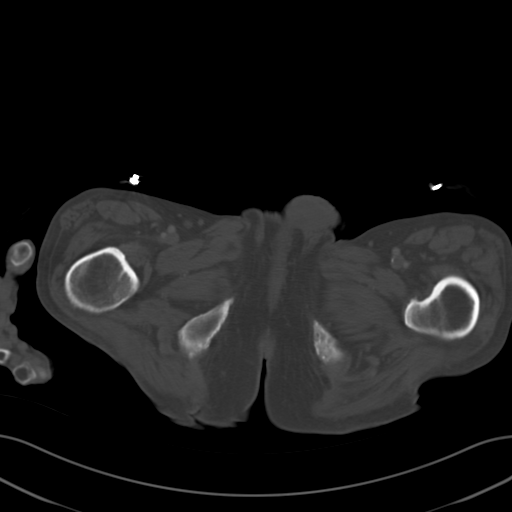
[im 23/135  soft-tissue]
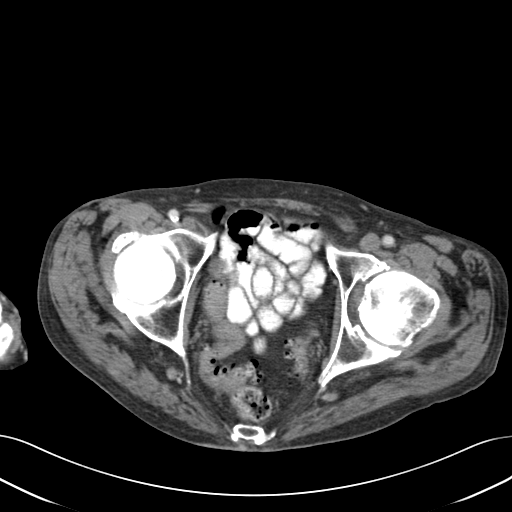
[im 38/135  soft-tissue]
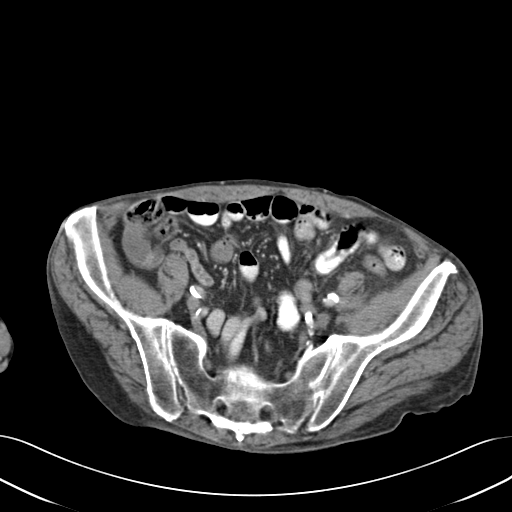
[im 53/135  soft-tissue]
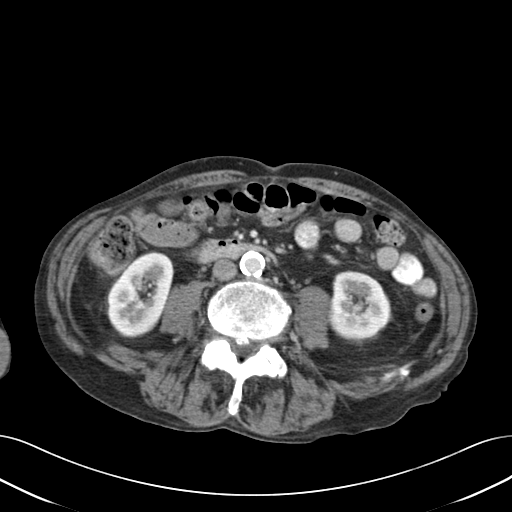
[im 68/135  soft-tissue]
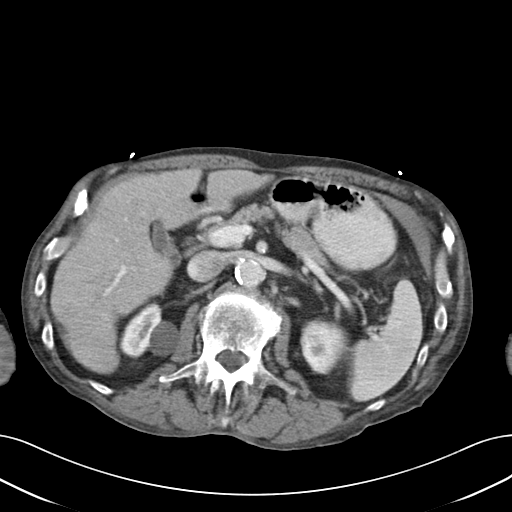
[im 82/135  soft-tissue]
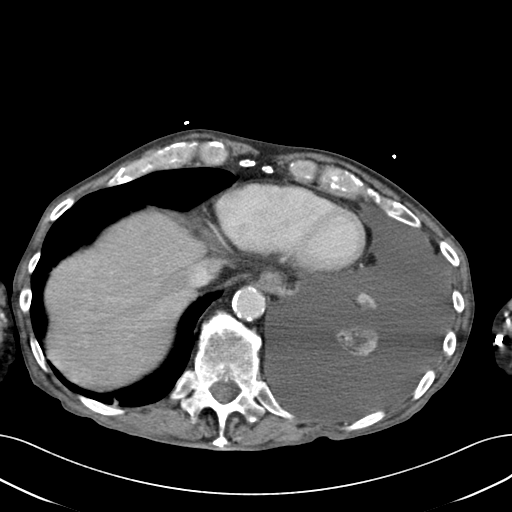
[im 97/135  soft-tissue]
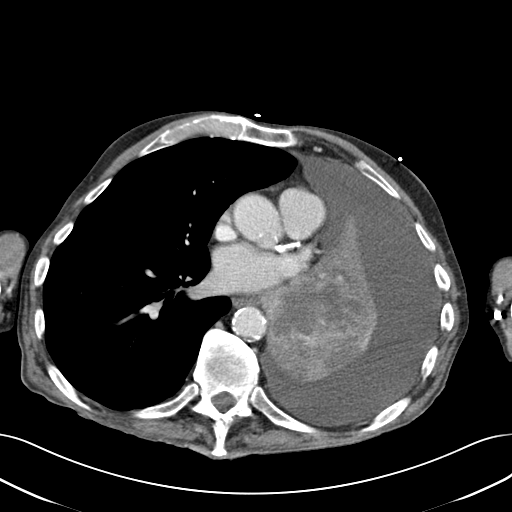
[im 112/135  soft-tissue]
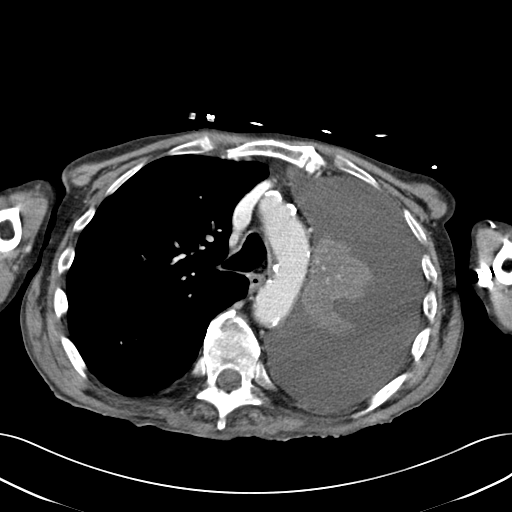
[im 127/135  soft-tissue]
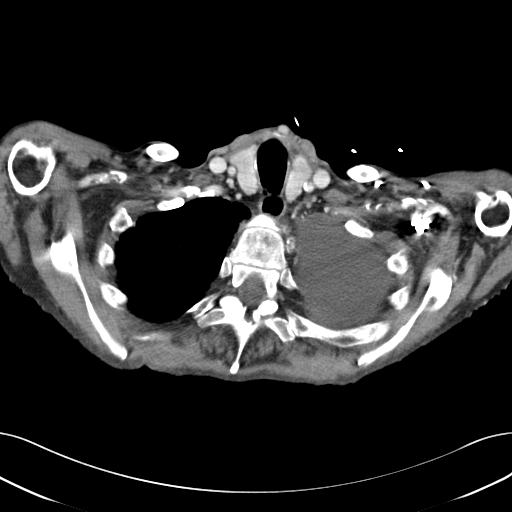
[im 127/135  bone]
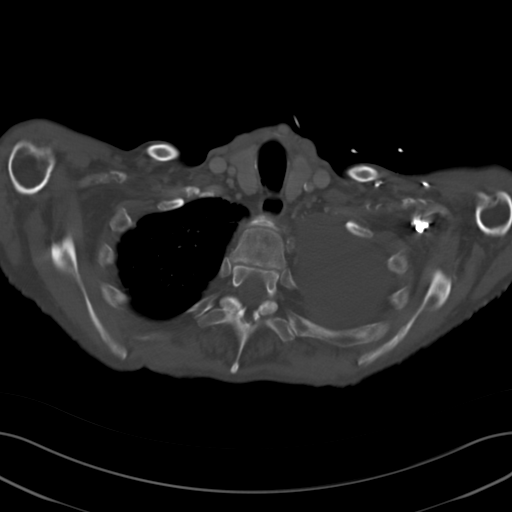

[Series 6: cor cap with cor · coronal · 0.68mm/px · 3 of 176 slices shown]
[im 59/176  soft-tissue]
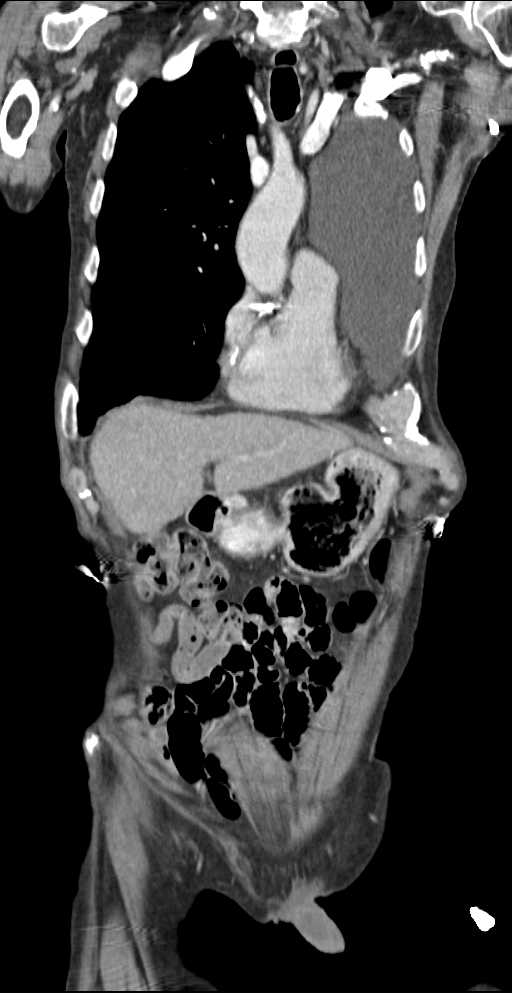
[im 78/176  soft-tissue]
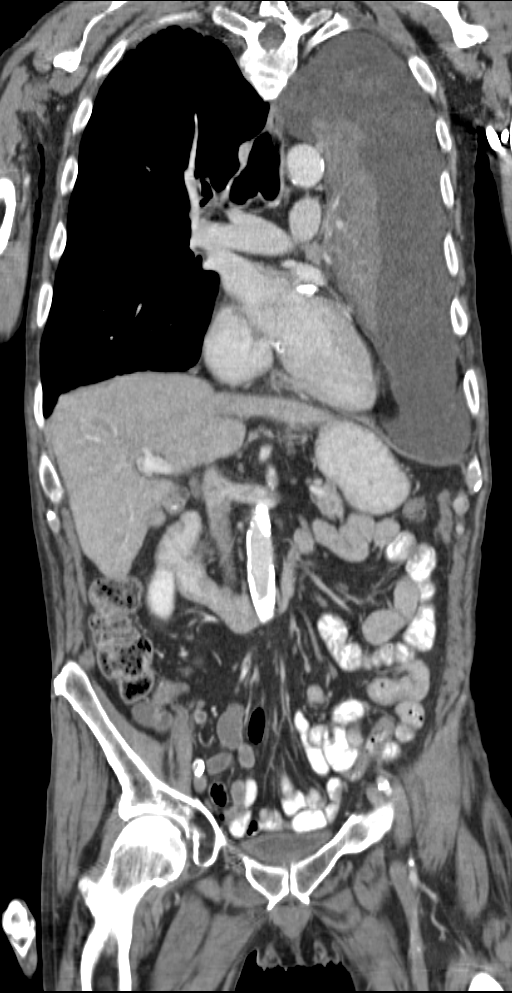
[im 98/176  soft-tissue]
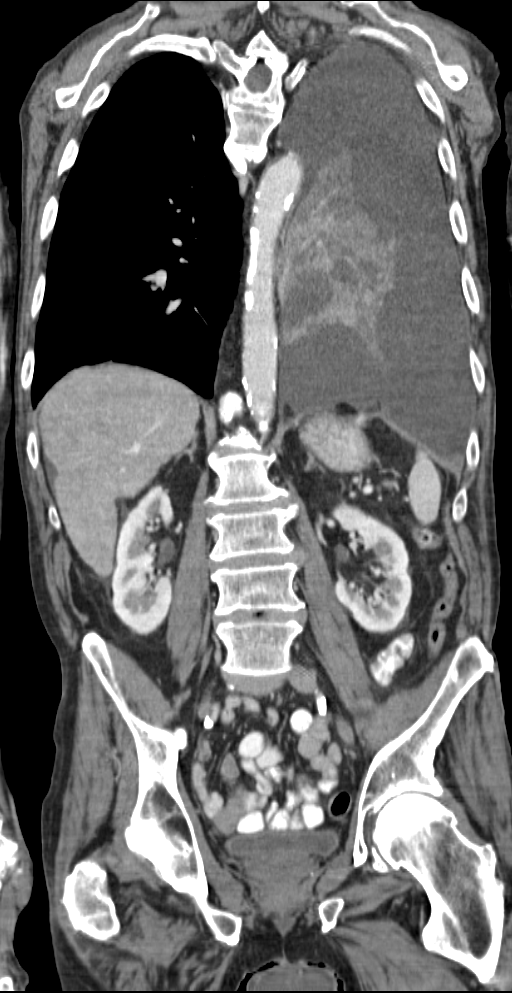

[12 of 46 positions shown; findings below may reference images not displayed]

FINDINGS: CT CHEST FINDINGS

Musculoskeletal: Thoracic spine DISH. No pathologic compression
fractures of the thoracic spine. Sternum appears within normal
limits. No displaced rib fractures.

Lungs: The LEFT lung is completely collapsed. There are areas of
necrosis in the LEFT lower lobe, most compatible with necrotic
neoplasm based on the large effusion. Necrotizing pneumonia is also
in the differential considerations. But the RIGHT lung demonstrates
severe centrilobular emphysema. Linear scarring or atelectasis is
present in the RIGHT lower lobe.

Central airways: Trachea and RIGHT central airways are patent. There
is fluid filling the LEFT mainstem bronchus and all of the distal
LEFT bronchi.

Vasculature: Atherosclerosis. No acute vascular abnormality. There
is no occlusion of the pulmonary arteries. No large central
pulmonary embolus identified.

Effusions: Large LEFT pleural effusion compressing the LEFT lung. No
pericardial effusion. No RIGHT pleural effusion.

Lymphadenopathy: There is no axillary adenopathy. LEFT hilar
adenopathy is difficult to evaluate. There appears to be abnormal
enhancing tissue around the LEFT mainstem bronchus and LEFT hilum
however no discrete lymph nodes are identified. Prevascular nodal
station is normal. No subcarinal lymphadenopathy or RIGHT hilar
adenopathy.

Esophagus: Within normal limits.

Upper abdomen: See below.

Other: Mild bilateral gynecomastia. LEFT thyroid lobe
calcifications.

CT ABDOMEN AND PELVIS FINDINGS

Musculoskeletal: No pathologic fracture in the lumbar spine. Severe
bilateral hip osteoarthritis is present. No aggressive osseous
lesions are identified.

Lung Bases: See above.

Liver: There is a heterogeneous nodular appearance of the liver
compatible with hepatic cirrhosis. No discrete mass lesion is
identified. There is heterogeneous attenuation in the posterior
RIGHT hepatic lobe but no discrete mass is identified to suggest
metastasis. This is probably due to some beam hardening artifact
from the extremities being at the side. Attention on follow-up is
recommended.

Spleen:  Normal.

Gallbladder: Contracted. Calcified stones are present in the neck of
the gallbladder. No CT evidence of acute cholecystitis.

Common bile duct: Within normal limits. No calcified common duct
stone.

Pancreas:  Normal.

Adrenal glands:  Normal bilaterally.

Kidneys: Renal enhancement is within normal limits. No renal mass.
RIGHT upper pole renal cyst is simple and measures 27 mm. Tiny
interpolar LEFT renal cyst. Both ureters appear within normal
limits. No calculi.

Stomach:  Normal.

Small bowel:  Normal.

Colon:   Colonic diverticulosis.

Pelvic Genitourinary:  Within normal limits.

Peritoneum: No free air.  No ascites.

Vasculature: Atherosclerosis without an acute vascular abnormality.

Body Wall: Within normal limits.
IMPRESSION: 1. Large LEFT pleural effusion. This is favored to be malignant
pleural effusion. Thoracentesis with cytology recommended.
2. Complete collapse of the LEFT lung. Nonenhancing areas in the
LEFT lower lobe are most compatible with a mass with central
necrosis. Necrotizing pneumonia is in the differential
considerations.
3. Fluid fills the LEFT mainstem bronchus. Probable LEFT hilar
adenopathy narrowing the LEFT mainstem bronchus. LEFT hilum poorly
evaluated because of collapse of the LEFT lung.
4. Atherosclerosis and coronary artery disease without an acute
vascular abnormality.
5. Severe emphysema in the RIGHT lung.
6. Hepatic cirrhosis. No convincing evidence of hepatic metastatic
disease.
